# Patient Record
Sex: Male | Born: 1960 | Race: White | Hispanic: No | Marital: Single | Smoking: Current every day smoker
Health system: Southern US, Community
[De-identification: ages and names within clinical notes are randomized; demographics above are authoritative.]

---

## 1999-03-06 ENCOUNTER — Encounter: Payer: Self-pay | Admitting: Emergency Medicine

## 1999-03-06 ENCOUNTER — Emergency Department (HOSPITAL_COMMUNITY): Admission: EM | Admit: 1999-03-06 | Discharge: 1999-03-06 | Payer: Self-pay | Admitting: Emergency Medicine

## 2004-03-01 ENCOUNTER — Emergency Department (HOSPITAL_COMMUNITY): Admission: EM | Admit: 2004-03-01 | Discharge: 2004-03-01 | Payer: Self-pay

## 2005-01-03 ENCOUNTER — Ambulatory Visit: Payer: Self-pay | Admitting: Psychiatry

## 2005-01-03 ENCOUNTER — Inpatient Hospital Stay (HOSPITAL_COMMUNITY): Admission: RE | Admit: 2005-01-03 | Discharge: 2005-01-06 | Payer: Self-pay | Admitting: Psychiatry

## 2012-10-13 ENCOUNTER — Emergency Department (HOSPITAL_COMMUNITY)
Admission: EM | Admit: 2012-10-13 | Discharge: 2012-10-13 | Disposition: A | Discharge status: Home / Self Care | Payer: Self-pay | Attending: Emergency Medicine | Admitting: Emergency Medicine

## 2012-10-13 ENCOUNTER — Encounter (HOSPITAL_COMMUNITY): Payer: Self-pay | Admitting: Emergency Medicine

## 2012-10-13 ENCOUNTER — Emergency Department (HOSPITAL_COMMUNITY): Payer: Self-pay

## 2012-10-13 DIAGNOSIS — Z791 Long term (current) use of non-steroidal anti-inflammatories (NSAID): Secondary | ICD-10-CM | POA: Insufficient documentation

## 2012-10-13 DIAGNOSIS — R55 Syncope and collapse: Secondary | ICD-10-CM | POA: Insufficient documentation

## 2012-10-13 DIAGNOSIS — F17200 Nicotine dependence, unspecified, uncomplicated: Secondary | ICD-10-CM | POA: Insufficient documentation

## 2012-10-13 LAB — BASIC METABOLIC PANEL
BUN: 12 mg/dL (ref 6–23)
Calcium: 9.6 mg/dL (ref 8.4–10.5)
Creatinine, Ser: 0.72 mg/dL (ref 0.50–1.35)
GFR calc Af Amer: >90 mL/min (ref >90)
GFR calc non Af Amer: >90 mL/min (ref >90)

## 2012-10-13 LAB — CBC WITH DIFFERENTIAL/PLATELET
Basophils Absolute: 0 10*3/uL (ref 0.0–0.1)
Basophils Relative: 0 % (ref 0–1)
Eosinophils Absolute: 0.2 10*3/uL (ref 0.0–0.7)
HCT: 41.5 % (ref 39.0–52.0)
Hemoglobin: 14.6 g/dL (ref 13.0–17.0)
MCH: 31.1 pg (ref 26.0–34.0)
MCHC: 35.2 g/dL (ref 30.0–36.0)
Monocytes Absolute: 0.6 10*3/uL (ref 0.1–1.0)
Monocytes Relative: 7 % (ref 3–12)
RDW: 13.3 % (ref 11.5–15.5)

## 2012-10-13 LAB — URINALYSIS, ROUTINE W REFLEX MICROSCOPIC
Bilirubin Urine: NEGATIVE
Ketones, ur: NEGATIVE mg/dL
Leukocytes, UA: NEGATIVE
Nitrite: NEGATIVE
Protein, ur: NEGATIVE mg/dL
Urobilinogen, UA: 0.2 mg/dL (ref 0.0–1.0)
pH: 6.5 (ref 5.0–8.0)

## 2012-10-13 LAB — GLUCOSE, CAPILLARY: Glucose-Capillary: 84 mg/dL (ref 70–99)

## 2012-10-13 NOTE — ED Notes (Signed)
Pt reports while he was working today he began to get lightheaded along with some blurred vision and couldn't focus his eyes well, pt said he started to freak out about it and went outside to get some fresh air then he started having bilateral pins and needles feeling in lower legs, pt reports he couldn't calm down then he noticed he was having some chest tightness, pt called his boss to take him to ED. Pt reports all symptoms intensified on his drive here and once he got here they started to resolve. Pt denies diaphoreses/n/v/d during these episodes.

## 2012-10-13 NOTE — ED Provider Notes (Signed)
History     CSN: [id number]  Arrival date & time 10/13/12  1159   First MD Initiated Contact with Patient 10/13/12 1357      Chief Complaint  Patient presents with  . Numbness  . Blurred Vision    (Consider location/radiation/quality/duration/timing/severity/associated sxs/prior treatment) HPI Comments: Bryan Keller presents ambulatory for evaluation of weakness.  He reports becoming acutely lightheaded at work.  This was followed by tunnel vision and very mild shortness of breath.  He states his vision was also blurred but he denies unilateral weakness, difficulty forming words, CP, palpitations, fever, NVD, and abdominal pain.  He states he did feel somewhat tired throughout the last 3-4 days but not in any specific manner.  He denies dizziness, tinnitus, or vertigo-like symptoms.  Patient is a 51 y.o. male presenting with weakness. The history is provided by the patient. No language interpreter was used.  Weakness The primary symptoms include dizziness. Primary symptoms do not include headaches, syncope, loss of consciousness, altered mental status or seizures. The symptoms began 1 to 2 hours ago. The symptoms are improving.  Dizziness also occurs with weakness. Dizziness does not occur with tinnitus.  Additional symptoms include weakness and anxiety. Additional symptoms do not include neck stiffness, pain, lower back pain, loss of balance, photophobia, aura, hallucinations, nystagmus, taste disturbance, hyperacusis, hearing loss, tinnitus, vertigo, irritability or dysphoric mood. Medical issues do not include seizures, cerebral vascular accident, cancer, alcohol use, drug use, diabetes, hypertension or recent surgery. Workup history does not include MRI, CT scan, EEG, cerebral angiography, lumbar puncture, carotid ultrasound or cardiac workup.    History reviewed. No pertinent past medical history.  History reviewed. No pertinent past surgical history.  History reviewed. No pertinent  family history.  History  Substance Use Topics  . Smoking status: Current Every Day Smoker  . Smokeless tobacco: Not on file  . Alcohol Use: No      Review of Systems  Constitutional: Negative for irritability.  HENT: Negative for hearing loss, neck stiffness and tinnitus.   Eyes: Negative for photophobia.  Cardiovascular: Negative for syncope.  Neurological: Positive for dizziness, weakness and numbness (bilateral feet and hands). Negative for vertigo, seizures, loss of consciousness, headaches and loss of balance.  Psychiatric/Behavioral: Negative for hallucinations, dysphoric mood and altered mental status.  All other systems reviewed and are negative.    Allergies  Review of patient's allergies indicates no known allergies.  Home Medications   Current Outpatient Rx  Name  Route  Sig  Dispense  Refill  . IBUPROFEN 200 MG PO TABS   Oral   Take 600-800 mg by mouth every 6 (six) hours as needed. For pain         . SILDENAFIL CITRATE 50 MG PO TABS   Oral   Take 50 mg by mouth daily as needed. For erectile dysfunction           BP 125/81  Pulse 62  Temp 97.9 F (36.6 C) (Oral)  Resp 16  SpO2 99%  Physical Exam  Nursing note and vitals reviewed. Constitutional: He is oriented to person, place, and time. He appears well-developed and well-nourished. No distress.  HENT:  Head: Normocephalic and atraumatic.  Right Ear: External ear normal.  Left Ear: External ear normal.  Nose: Nose normal.  Mouth/Throat: Oropharynx is clear and moist. No oropharyngeal exudate.  Eyes: Conjunctivae normal and EOM are normal. Pupils are equal, round, and reactive to light. Right eye exhibits no discharge. Left eye exhibits no discharge.  No scleral icterus.  Neck: Normal range of motion. Neck supple. No JVD present. No spinous process tenderness and no muscular tenderness present. No rigidity. No tracheal deviation, no edema, no erythema and normal range of motion present.    Cardiovascular: Normal rate, regular rhythm, normal heart sounds and intact distal pulses.  Exam reveals no gallop and no friction rub.   No murmur heard. Pulmonary/Chest: Effort normal and breath sounds normal. No stridor. No respiratory distress. He has no wheezes. He has no rales. He exhibits no tenderness.  Abdominal: Soft. Bowel sounds are normal. He exhibits no distension and no mass. There is no tenderness. There is no rebound and no guarding.  Musculoskeletal: Normal range of motion. He exhibits no edema and no tenderness.  Lymphadenopathy:    He has no cervical adenopathy.  Neurological: He is alert and oriented to person, place, and time. He displays no atrophy and no tremor. No cranial nerve deficit. He exhibits normal muscle tone. He displays no seizure activity. Coordination (nl finger to nose) normal. GCS eye subscore is 4. GCS verbal subscore is 5. GCS motor subscore is 6. He displays no Babinski's sign on the right side. He displays no Babinski's sign on the left side.  Skin: Skin is warm and dry. No rash noted. He is not diaphoretic. No erythema. No pallor.  Psychiatric: He has a normal mood and affect. His behavior is normal. Judgment and thought content normal. His mood appears not anxious. His affect is not angry, not blunt, not labile and not inappropriate. His speech is not rapid and/or pressured, not delayed, not tangential and not slurred. He is not agitated, not aggressive, is not hyperactive, not slowed, not withdrawn, not actively hallucinating and not combative. Thought content is not paranoid and not delusional. Cognition and memory are normal. Cognition and memory are not impaired. He does not express impulsivity or inappropriate judgment. He does not exhibit a depressed mood. He expresses no homicidal and no suicidal ideation. He expresses no suicidal plans and no homicidal plans. He is communicative. He exhibits normal recent memory and normal remote memory. He is  attentive.    ED Course  Procedures (including critical care time)   Labs Reviewed  CBC WITH DIFFERENTIAL  BASIC METABOLIC PANEL  POCT I-STAT TROPONIN I  GLUCOSE, CAPILLARY  URINALYSIS, ROUTINE W REFLEX MICROSCOPIC   No results found.   No diagnosis found.   Date: 10/13/2012  Rate: 73 bpm  Rhythm: normal sinus rhythm  QRS Axis: normal  Intervals: normal  ST/T Wave abnormalities: normal  Conduction Disutrbances:none  Narrative Interpretation:   Old EKG Reviewed: none available      MDM  Pt presents for evaluation after a near syncopal event this morning.  He appears comfortable, NAD.  Note a mildly elevated BP but otherwise stable VS.  He reports he is a lifelong smoker but denies any family history of sudden cardiac death, early CAD, or seizure disorder.  Plan basic labs, CXR, EKG, orthostatic VS.  He has no focal neurologic deficits.  Will review results as available and provide the appropriate disposition.  1700.  Pt stable, NAD.  Note nl labs.  Nl orthostatic VS.  Trop negative x 2.  Symptoms have completely resolved.  Encouraged outpt follow-up with a primary physician.  Pt advised that if he has other similar episodes, he should return to the ER for further testing.      Tobin Chad, MD 10/13/12 [phone]

## 2012-10-13 NOTE — ED Notes (Signed)
Pt c/o not feeling right all weekend with some fatigue and then had blurry vision with some bilateral foot numbness starting today; no neuro deficits noted; pt sts some pressure in his chest also; EDP saw pt

## 2013-07-19 IMAGING — CR DG CHEST 2V
3 series · 3 of 3 positions shown · non-contrast
Comparison: None.

CLINICAL DATA: Chest pain shortness of breath, history of tobacco
use

CHEST - 2 VIEW

[w chest pa]
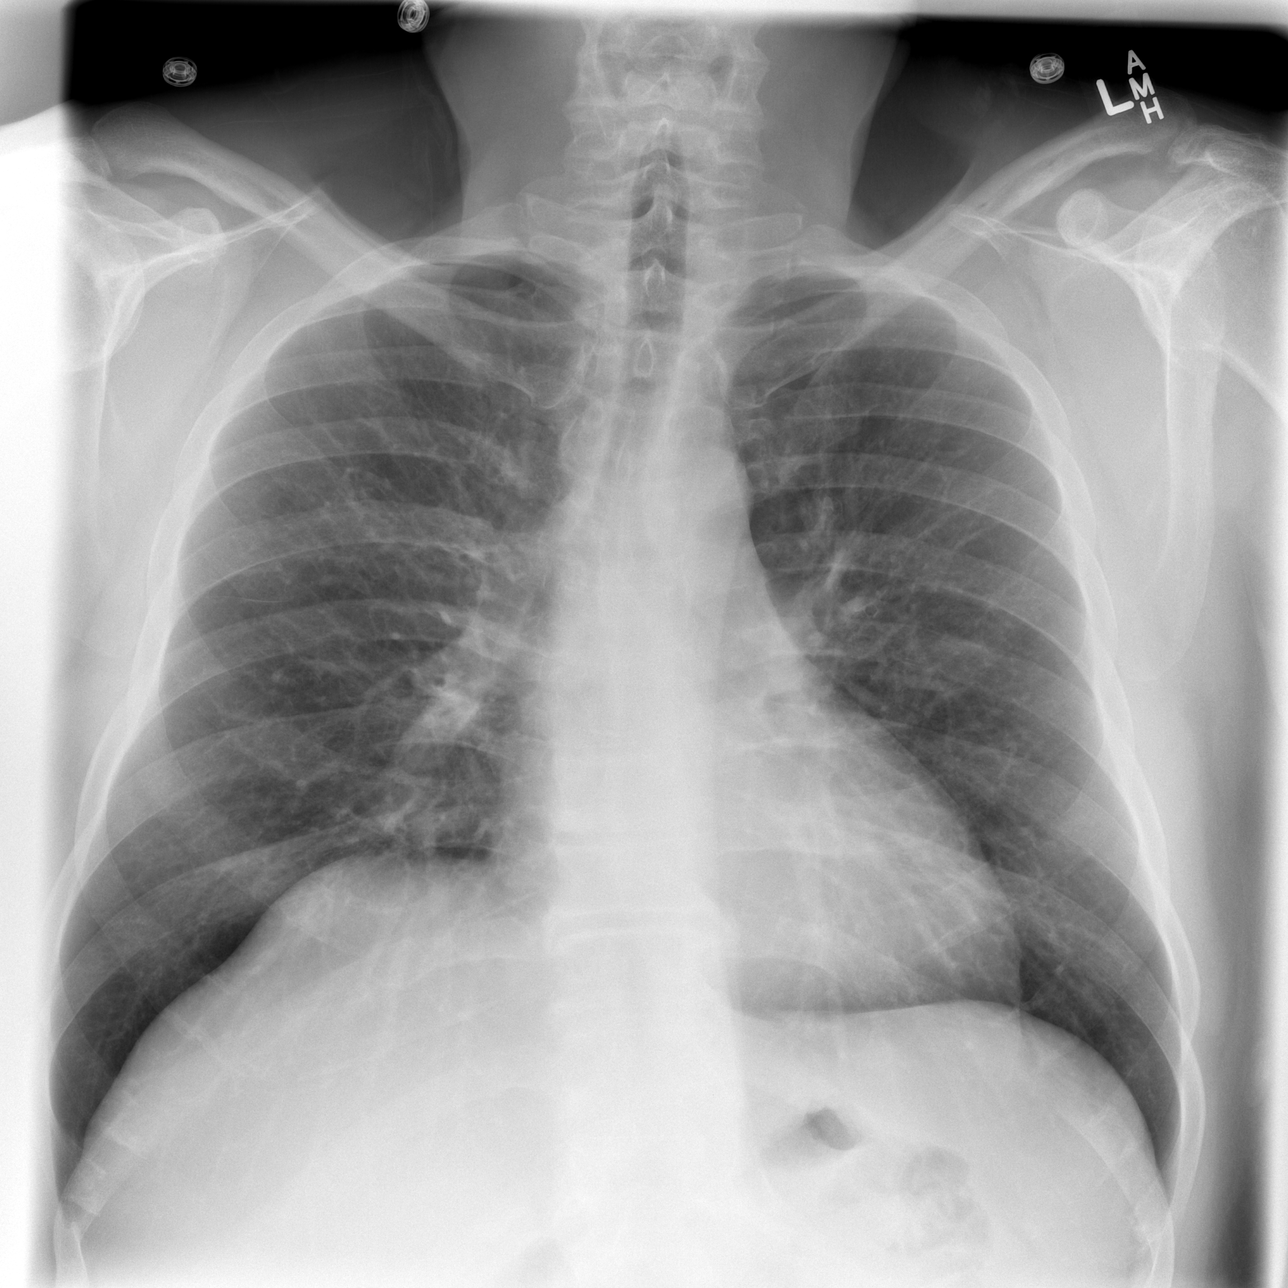

[w chest lat (1 of 2)]
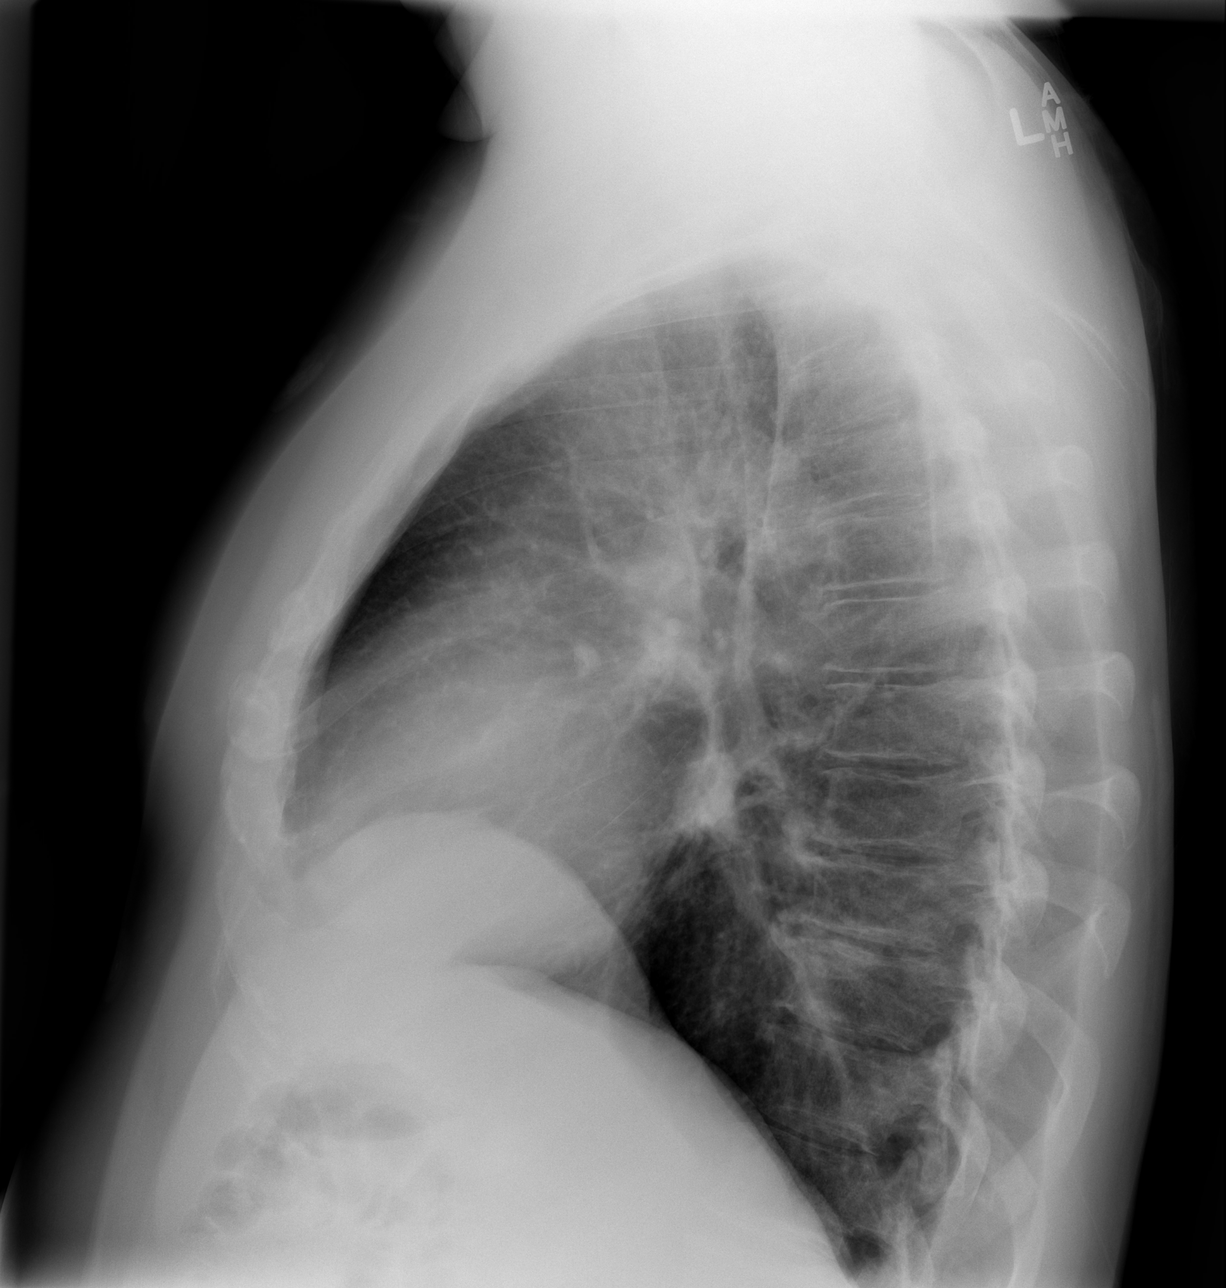

[w chest lat (2 of 2)]
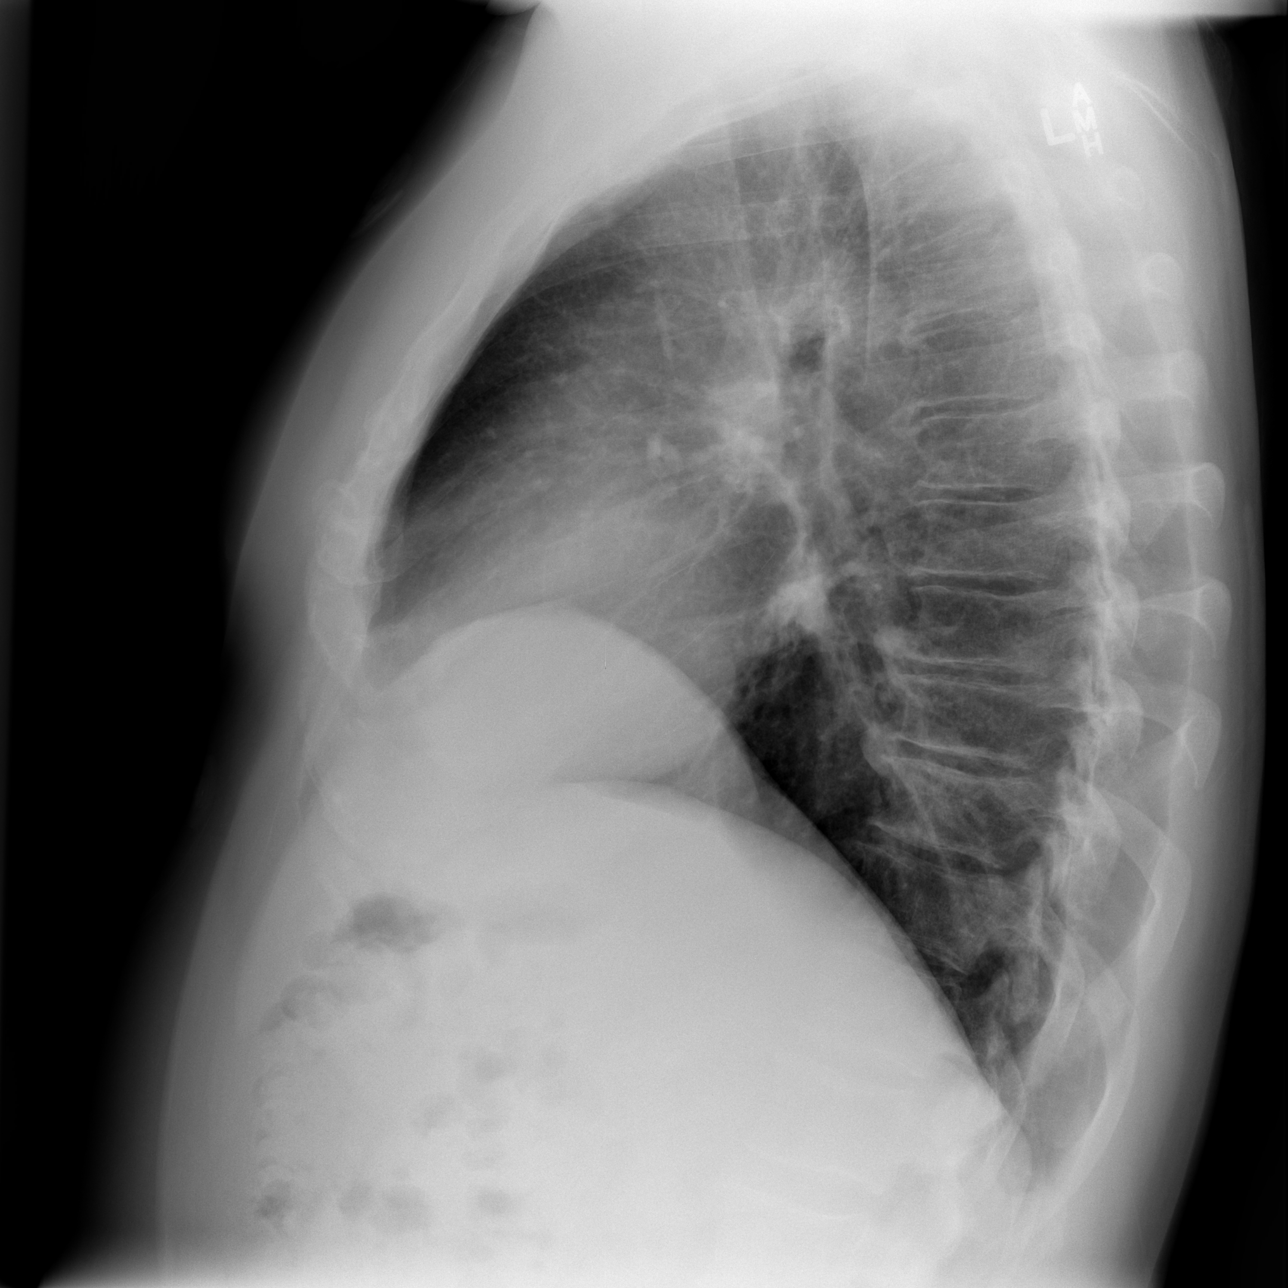

[3 of 3 positions shown; findings below may reference images not displayed]

FINDINGS: The heart and pulmonary vascularity are within normal
limits.  The lungs are clear bilaterally.  No focal infiltrate is
seen.  No acute osseous abnormality is noted.
IMPRESSION: No acute abnormality is noted.

## 2022-11-23 ENCOUNTER — Ambulatory Visit
Admission: EM | Admit: 2022-11-23 | Discharge: 2022-11-23 | Disposition: A | Discharge status: Home / Self Care | Payer: Self-pay | Attending: Emergency Medicine | Admitting: Emergency Medicine

## 2022-11-23 DIAGNOSIS — R030 Elevated blood-pressure reading, without diagnosis of hypertension: Secondary | ICD-10-CM

## 2022-11-23 DIAGNOSIS — H60502 Unspecified acute noninfective otitis externa, left ear: Secondary | ICD-10-CM

## 2022-11-23 MED ORDER — OFLOXACIN 0.3 % OT SOLN: 10.0000 [drp] | Freq: Every day | OTIC | 0 refills | Status: AC

## 2022-11-23 NOTE — ED Provider Notes (Signed)
Renaldo Fiddler    CSN: [id number] Arrival date & time: 11/23/22  1227      History   Chief Complaint Chief Complaint  Patient presents with   Otalgia    HPI Bryan Keller is a 61 y.o. male.  Patient presents with 2 day history of left ear pain and drainage.  No fever, rash, sore throat, cough, shortness of breath, vomiting, diarrhea, or other symptoms.  No OTC medications taken.    The history is provided by the patient and medical records.    History reviewed. No pertinent past medical history.  There are no problems to display for this patient.   History reviewed. No pertinent surgical history.     Home Medications    Prior to Admission medications   Medication Sig Start Date End Date Taking? Authorizing Provider  ofloxacin (FLOXIN) 0.3 % OTIC solution Place 10 drops into the left ear daily. 11/23/22  Yes Mickie Bail, NP  ibuprofen (ADVIL,MOTRIN) 200 MG tablet Take 600-800 mg by mouth every 6 (six) hours as needed. For pain    [provider]  sildenafil (VIAGRA) 50 MG tablet Take 50 mg by mouth daily as needed. For erectile dysfunction    [provider]    Family History No family history on file.  Social History Social History   Tobacco Use   Smoking status: Every Day  Substance Use Topics   Alcohol use: No   Drug use: No    Comment: former     Allergies   Patient has no known allergies.   Review of Systems Review of Systems  Constitutional:  Negative for chills and fever.  HENT:  Positive for ear discharge and ear pain. Negative for sore throat.   Respiratory:  Negative for cough and shortness of breath.   Gastrointestinal:  Negative for diarrhea and vomiting.  Skin:  Negative for color change and rash.  All other systems reviewed and are negative.    Physical Exam Triage Vital Signs ED Triage Vitals [11/23/22 1255]  Enc Vitals Group     BP      Pulse Rate 70     Resp 18     Temp 98.3 F (36.8 C)      Temp src      SpO2 95 %     Weight      Height      Head Circumference      Peak Flow      Pain Score      Pain Loc      Pain Edu?      Excl. in [place]?    No data found.  Updated Vital Signs BP (!) 153/89   Pulse 70   Temp 98.3 F (36.8 C)   Resp 18   Ht 6\' 3"  (1.905 m)   Wt 274 lb (124.3 kg)   SpO2 95%   BMI 34.25 kg/m   Visual Acuity Right Eye Distance:   Left Eye Distance:   Bilateral Distance:    Right Eye Near:   Left Eye Near:    Bilateral Near:     Physical Exam Vitals and nursing note reviewed.  Constitutional:      General: He is not in acute distress.    Appearance: Normal appearance. He is well-developed. He is not ill-appearing.  HENT:     Right Ear: Tympanic membrane and ear canal normal.     Left Ear: Tympanic membrane normal. Drainage present.  Ears:     Comments: Left ear canal tender with movement and purulent drainage. TM clear.    Nose: Nose normal.     Mouth/Throat:     Mouth: Mucous membranes are moist.     Pharynx: Oropharynx is clear.  Cardiovascular:     Rate and Rhythm: Normal rate and regular rhythm.     Heart sounds: Normal heart sounds.  Pulmonary:     Effort: Pulmonary effort is normal. No respiratory distress.     Breath sounds: Normal breath sounds.  Musculoskeletal:     Cervical back: Neck supple.  Skin:    General: Skin is warm and dry.  Neurological:     Mental Status: He is alert.  Psychiatric:        Mood and Affect: Mood normal.        Behavior: Behavior normal.      UC Treatments / Results  Labs (all labs ordered are listed, but only abnormal results are displayed) Labs Reviewed - No data to display  EKG   Radiology No results found.  Procedures Procedures (including critical care time)  Medications Ordered in UC Medications - No data to display  Initial Impression / Assessment and Plan / UC Course  I have reviewed the triage vital signs and the nursing notes.  Pertinent labs & imaging results  that were available during my care of the patient were reviewed by me and considered in my medical decision making (see chart for details).   Acute otitis externa, elevated blood pressure reading.  Treating with ofloxacin eardrops.  Instructed patient to follow-up with his PCP if his symptoms or not improving.  Education provided on otitis externa.  Blood pressure elevated today and instructed patient to have this rechecked by his PCP.  He agrees to plan of care.   Final Clinical Impressions(s) / UC Diagnoses   Final diagnoses:  Acute otitis externa of left ear, unspecified type  Elevated blood pressure reading     Discharge Instructions      Use the antibiotic ear drops as directed.  Follow up with your primary care provider if your symptoms are not improving.    Your blood pressure is elevated today at 153/89.  Please have this rechecked by your primary care provider in 2-4 weeks.          ED Prescriptions     Medication Sig Dispense Auth. Provider   ofloxacin (FLOXIN) 0.3 % OTIC solution Place 10 drops into the left ear daily. 5 mL Mickie Bail, NP      PDMP not reviewed this encounter.   Mickie Bail, NP 11/23/22 1348

## 2022-11-23 NOTE — ED Triage Notes (Signed)
Patient to Urgent Care with complaints of left sided ear pain x2 days. Reports he feels some drainage at night. Reports now he also has some soreness in his jaw.  Denies any known fevers.

## 2022-11-23 NOTE — Discharge Instructions (Addendum)
Use the antibiotic ear drops as directed.  Follow up with your primary care provider if your symptoms are not improving.    Your blood pressure is elevated today at 153/89.  Please have this rechecked by your primary care provider in 2-4 weeks.

## 2023-03-12 ENCOUNTER — Ambulatory Visit
Admission: EM | Admit: 2023-03-12 | Discharge: 2023-03-12 | Disposition: A | Discharge status: Home / Self Care | Payer: Self-pay | Attending: Urgent Care | Admitting: Urgent Care

## 2023-03-12 DIAGNOSIS — L239 Allergic contact dermatitis, unspecified cause: Secondary | ICD-10-CM

## 2023-03-12 MED ORDER — METHYLPREDNISOLONE ACETATE 40 MG/ML IJ SUSP
40.0000 mg | Freq: Once | INTRAMUSCULAR | Status: AC
  Administered 2023-03-12: 40 mg via INTRAMUSCULAR

## 2023-03-12 MED ORDER — OLOPATADINE HCL 0.2 % OP SOLN: 1.0000 [drp] | Freq: Two times a day (BID) | OPHTHALMIC | 0 refills | Status: AC

## 2023-03-12 MED ORDER — PREDNISONE 10 MG (21) PO TBPK: ORAL_TABLET | Freq: Every day | ORAL | 0 refills | Status: AC

## 2023-03-12 NOTE — ED Provider Notes (Signed)
Bryan Keller    CSN: [id number] Arrival date & time: 03/12/23  1109      History   Chief Complaint Chief Complaint  Patient presents with   Eye Problem    HPI Bryan Keller is a 62 y.o. male.    Eye Problem   Urgent care with complaint of bilateral eye drainage and swelling starting 2 days ago.  He states he woke up with his eyes crusted shut.  Also complains of rash on his bilateral arms and lower legs.  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  He states current symptoms are what he typically experiences with poison ivy.  Treated at [facility] for similar symptoms in 2020 and given steroid injection along with at home taper which she states was effective.  History reviewed. No pertinent past medical history.  There are no problems to display for this patient.   History reviewed. No pertinent surgical history.     Home Medications    Prior to Admission medications   Medication Sig Start Date End Date Taking? Authorizing Provider  ibuprofen (ADVIL,MOTRIN) 200 MG tablet Take 600-800 mg by mouth every 6 (six) hours as needed. For pain    [provider]  ofloxacin (FLOXIN) 0.3 % OTIC solution Place 10 drops into the left ear daily. Patient not taking: Reported on 03/12/2023 11/23/22   Sharion Balloon, NP  sildenafil (VIAGRA) 50 MG tablet Take 50 mg by mouth daily as needed. For erectile dysfunction    [provider]    Family History History reviewed. No pertinent family history.  Social History Social History   Tobacco Use   Smoking status: Every Day  Substance Use Topics   Alcohol use: No   Drug use: No    Comment: former     Allergies   Patient has no known allergies.   Review of Systems Review of Systems   Physical Exam Triage Vital Signs ED Triage Vitals  Enc Vitals Group     BP 03/12/23 1149 (!) 152/84     Pulse Rate 03/12/23 1149 81     Resp 03/12/23 1149 18     Temp  03/12/23 1149 97.9 F (36.6 C)     Temp src --      SpO2 03/12/23 1149 95 %     Weight --      Height --      Head Circumference --      Peak Flow --      Pain Score 03/12/23 1152 2     Pain Loc --      Pain Edu? --      Excl. in [place]? --    No data found.  Updated Vital Signs BP (!) 152/84   Pulse 81   Temp 97.9 F (36.6 C)   Resp 18   SpO2 95%   Visual Acuity Right Eye Distance:   Left Eye Distance:   Bilateral Distance:    Right Eye Near:   Left Eye Near:    Bilateral Near:     Physical Exam Vitals reviewed.  Constitutional:      Appearance: Normal appearance.  Skin:    General: Skin is warm and dry.     Findings: Rash present.  Neurological:     General: No focal deficit present.     Mental Status: He is alert and oriented to person, place, and time.  Psychiatric:        Mood  and Affect: Mood normal.        Behavior: Behavior normal.      UC Treatments / Results  Labs (all labs ordered are listed, but only abnormal results are displayed) Labs Reviewed - No data to display  EKG   Radiology No results found.  Procedures Procedures (including critical care time)  Medications Ordered in UC Medications - No data to display  Initial Impression / Assessment and Plan / UC Course  I have reviewed the triage vital signs and the nursing notes.  Pertinent labs & imaging results that were available during my care of the patient were reviewed by me and considered in my medical decision making (see chart for details).   Bilateral orbital edema with erythema is present.  No cellulitis.  Mild rash on his bilateral arms and lower legs.  Will treat for presumed poison ivy rash with an injection of Depo-Medrol 40 mg IM in clinic.  Will discharge with prednisone taper over 6 days.  Final Clinical Impressions(s) / UC Diagnoses   Final diagnoses:  None   Discharge Instructions   None    ED Prescriptions   None    PDMP not reviewed this encounter.    Rose Phi, [place] 03/12/23 1233

## 2023-03-12 NOTE — Discharge Instructions (Addendum)
Follow up here or with your primary care provider if your symptoms are worsening or not improving.     

## 2023-03-12 NOTE — ED Triage Notes (Addendum)
Patient to Urgent Care with complaints of bilateral eye drainage and swelling that started Sunday morning. Woke up with eyes crusted shut. Rash present to bilateral arms and lower legs. Reports hx of poison ivy and this has present the same.  Denies any known fevers. Taking benadryl.
